# Patient Record
Sex: Male | Born: 1990 | Race: Black or African American | Hispanic: No | Marital: Single | Smoking: Current every day smoker
Health system: Southern US, Community
[De-identification: ages and names within clinical notes are randomized; demographics above are authoritative.]

---

## 2012-01-04 ENCOUNTER — Emergency Department (INDEPENDENT_AMBULATORY_CARE_PROVIDER_SITE_OTHER)
Admission: EM | Admit: 2012-01-04 | Discharge: 2012-01-04 | Disposition: A | Discharge status: Home / Self Care | Payer: Medicaid Other | Source: Home / Self Care | Attending: Family Medicine | Admitting: Family Medicine

## 2012-01-04 ENCOUNTER — Encounter (HOSPITAL_COMMUNITY): Payer: Self-pay | Admitting: *Deleted

## 2012-01-04 DIAGNOSIS — R21 Rash and other nonspecific skin eruption: Secondary | ICD-10-CM

## 2012-01-04 DIAGNOSIS — L309 Dermatitis, unspecified: Secondary | ICD-10-CM

## 2012-01-04 DIAGNOSIS — L259 Unspecified contact dermatitis, unspecified cause: Secondary | ICD-10-CM

## 2012-01-04 MED ORDER — TRIAMCINOLONE ACETONIDE 0.1 % EX CREA: TOPICAL_CREAM | Freq: Two times a day (BID) | CUTANEOUS | Status: DC

## 2012-01-04 MED ORDER — PREDNISONE 10 MG PO TABS: 10.0000 mg | ORAL_TABLET | Freq: Every day | ORAL | Status: DC

## 2012-01-04 NOTE — Discharge Instructions (Signed)

## 2012-01-04 NOTE — ED Provider Notes (Signed)
History     CSN: [id number]  Arrival date & time 01/04/12  1919   First MD Initiated Contact with Patient 01/04/12 2058      Chief Complaint  Patient presents with  . Rash    (Consider location/radiation/quality/duration/timing/severity/associated sxs/prior treatment) Patient is a 21 y.o. male presenting with rash. The history is provided by the patient. No language interpreter was used.  Rash  This is a chronic problem. Episode onset: several months. The problem is associated with an unknown factor. There has been no fever. The rash is present on the left arm, left upper leg, right upper leg and right arm. The pain is at a severity of 6/10. The pain is moderate. The pain has been worsening since onset. He has tried nothing for the symptoms. The treatment provided moderate relief.    History reviewed. No pertinent past medical history.  History reviewed. No pertinent past surgical history.  Family History  Problem Relation Age of Onset  . Diabetes Mother   . Hypertension Mother     History  Substance Use Topics  . Smoking status: Not on file  . Smokeless tobacco: Not on file  . Alcohol Use:       Review of Systems  Skin: Positive for rash.  All other systems reviewed and are negative.    Allergies  Review of patient's allergies indicates no known allergies.  Home Medications   Current Outpatient Rx  Name Route Sig Dispense Refill  . PREDNISONE 10 MG PO TABS Oral Take 1 tablet (10 mg total) by mouth daily. 21 tablet 0  . TRIAMCINOLONE ACETONIDE 0.1 % EX CREA Topical Apply topically 2 (two) times daily. 60 g 0    BP 124/70  Pulse 78  Temp(Src) 98.6 F (37 C) (Oral)  Resp 18  SpO2 100%  Physical Exam  Nursing note and vitals reviewed. Constitutional: He is oriented to person, place, and time. He appears well-developed and well-nourished.  HENT:  Head: Normocephalic and atraumatic.  Right Ear: External ear normal.  Left Ear: External ear normal.  Eyes:  Conjunctivae are normal. Pupils are equal, round, and reactive to light.  Cardiovascular: Normal rate and regular rhythm.   Pulmonary/Chest: Effort normal.  Abdominal: Soft.  Musculoskeletal: Normal range of motion.  Neurological: He is alert and oriented to person, place, and time.  Skin: Rash noted.  Psychiatric: He has a normal mood and affect.    ED Course  Procedures (including critical care time)  Labs Reviewed - No data to display No results found.   1. Eczema   2. Rash       MDM  Triamcinalone,   Cream,   Prednisone,   Referral to dermatology and [facility] to obtain a regular md        Lonia Skinner [place], [place] 01/04/12 2114

## 2012-01-04 NOTE — ED Notes (Signed)
Rash  On armpits      Both  Arms   And       Armpits  As  Well  As  Gluteal  Area          Started  Off in armpits     sev  Months  Ago       Spreading  Worse  sev  Weeks  Ago  It  Itches         No  New  Medications         No  Known  Causative      Agents     The  Rash   Itches     -  No  Angioedema      No  resp    Distress  He  Has  Tried    Neosporin  Cortisone  And  Zyrtec

## 2012-01-05 NOTE — ED Notes (Signed)
NO PAIN

## 2012-01-05 NOTE — ED Provider Notes (Signed)
Medical screening examination/treatment/procedure(s) were performed by resident physician or non-physician practitioner and as supervising physician I was immediately available for consultation/collaboration.   Roran Wegner DOUGLAS MD.    Ayansh Feutz D Jakaden Ouzts, MD 01/05/12 1219 

## 2012-12-08 ENCOUNTER — Encounter (HOSPITAL_COMMUNITY): Payer: Self-pay | Admitting: Emergency Medicine

## 2012-12-08 ENCOUNTER — Emergency Department (HOSPITAL_COMMUNITY): Payer: No Typology Code available for payment source

## 2012-12-08 ENCOUNTER — Emergency Department (HOSPITAL_COMMUNITY)
Admission: EM | Admit: 2012-12-08 | Discharge: 2012-12-09 | Disposition: A | Discharge status: Home / Self Care | Payer: No Typology Code available for payment source | Attending: Emergency Medicine | Admitting: Emergency Medicine

## 2012-12-08 DIAGNOSIS — Y9389 Activity, other specified: Secondary | ICD-10-CM | POA: Insufficient documentation

## 2012-12-08 DIAGNOSIS — Y92410 Unspecified street and highway as the place of occurrence of the external cause: Secondary | ICD-10-CM | POA: Insufficient documentation

## 2012-12-08 DIAGNOSIS — S0990XA Unspecified injury of head, initial encounter: Secondary | ICD-10-CM | POA: Insufficient documentation

## 2012-12-08 DIAGNOSIS — S0993XA Unspecified injury of face, initial encounter: Secondary | ICD-10-CM | POA: Insufficient documentation

## 2012-12-08 DIAGNOSIS — S8990XA Unspecified injury of unspecified lower leg, initial encounter: Secondary | ICD-10-CM | POA: Insufficient documentation

## 2012-12-08 DIAGNOSIS — S99919A Unspecified injury of unspecified ankle, initial encounter: Secondary | ICD-10-CM | POA: Insufficient documentation
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
## 2012-12-09 MED ORDER — IBUPROFEN 600 MG PO TABS: 600.0000 mg | ORAL_TABLET | Freq: Four times a day (QID) | ORAL | Status: DC | PRN

## 2012-12-09 MED ORDER — HYDROCODONE-ACETAMINOPHEN 5-325 MG PO TABS: 2.0000 | ORAL_TABLET | ORAL | Status: DC | PRN

## 2012-12-09 NOTE — ED Notes (Signed)
Rx x 2.  Pt voiced understanding to f/u with PCP and return for worsening condition.  

## 2012-12-10 NOTE — ED Provider Notes (Signed)
Medical screening examination/treatment/procedure(s) were performed by non-physician practitioner and as supervising physician I was immediately available for consultation/collaboration.    Shelda Jakes, MD 12/10/12 [phone]

## 2013-07-17 ENCOUNTER — Emergency Department (HOSPITAL_COMMUNITY)
Admission: EM | Admit: 2013-07-17 | Discharge: 2013-07-17 | Disposition: A | Discharge status: Home / Self Care | Payer: No Typology Code available for payment source | Attending: Emergency Medicine | Admitting: Emergency Medicine

## 2013-07-17 ENCOUNTER — Encounter (HOSPITAL_COMMUNITY): Payer: Self-pay | Admitting: Emergency Medicine

## 2013-07-17 DIAGNOSIS — Y9389 Activity, other specified: Secondary | ICD-10-CM | POA: Insufficient documentation

## 2013-07-17 DIAGNOSIS — S298XXA Other specified injuries of thorax, initial encounter: Secondary | ICD-10-CM | POA: Insufficient documentation

## 2013-07-17 DIAGNOSIS — Y92410 Unspecified street and highway as the place of occurrence of the external cause: Secondary | ICD-10-CM | POA: Insufficient documentation

## 2013-07-17 DIAGNOSIS — IMO0002 Reserved for concepts with insufficient information to code with codable children: Secondary | ICD-10-CM | POA: Insufficient documentation

## 2013-07-17 DIAGNOSIS — M549 Dorsalgia, unspecified: Secondary | ICD-10-CM

## 2013-07-17 MED ORDER — METHOCARBAMOL 500 MG PO TABS: 1000.0000 mg | ORAL_TABLET | Freq: Four times a day (QID) | ORAL | Status: AC

## 2013-07-17 MED ORDER — NAPROXEN 500 MG PO TABS: 500.0000 mg | ORAL_TABLET | Freq: Two times a day (BID) | ORAL | Status: AC
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
## 2013-07-17 NOTE — ED Provider Notes (Signed)
Medical screening examination/treatment/procedure(s) were performed by non-physician practitioner and as supervising physician I was immediately available for consultation/collaboration.  EKG Interpretation   None         Celene Kras, MD 07/17/13 [phone]

## 2018-08-18 ENCOUNTER — Encounter (HOSPITAL_COMMUNITY): Payer: Self-pay | Admitting: *Deleted

## 2018-08-18 ENCOUNTER — Emergency Department (HOSPITAL_COMMUNITY): Payer: Self-pay

## 2018-08-18 ENCOUNTER — Emergency Department (HOSPITAL_COMMUNITY)
Admission: EM | Admit: 2018-08-18 | Discharge: 2018-08-18 | Disposition: A | Discharge status: Home / Self Care | Payer: Self-pay | Attending: Emergency Medicine | Admitting: Emergency Medicine

## 2018-08-18 ENCOUNTER — Other Ambulatory Visit: Payer: Self-pay

## 2018-08-18 DIAGNOSIS — R0789 Other chest pain: Secondary | ICD-10-CM | POA: Insufficient documentation

## 2018-08-18 DIAGNOSIS — F17210 Nicotine dependence, cigarettes, uncomplicated: Secondary | ICD-10-CM | POA: Insufficient documentation

## 2018-08-18 LAB — BASIC METABOLIC PANEL
Anion gap: 8 (ref 5–15)
BUN: 11 mg/dL (ref 6–20)
CHLORIDE: 105 mmol/L (ref 98–111)
CO2: 25 mmol/L (ref 22–32)
Calcium: 9 mg/dL (ref 8.9–10.3)
Creatinine, Ser: 0.92 mg/dL (ref 0.61–1.24)
GFR calc Af Amer: >60 mL/min (ref >60)
GFR calc non Af Amer: >60 mL/min (ref >60)
GLUCOSE: 89 mg/dL (ref 70–99)
POTASSIUM: 3.8 mmol/L (ref 3.5–5.1)
SODIUM: 138 mmol/L (ref 135–145)

## 2018-08-18 LAB — CBC
HEMATOCRIT: 43.7 % (ref 39.0–52.0)
HEMOGLOBIN: 14.5 g/dL (ref 13.0–17.0)
MCH: 31.9 pg (ref 26.0–34.0)
MCHC: 33.2 g/dL (ref 30.0–36.0)
MCV: 96.3 fL (ref 80.0–100.0)
Platelets: 251 10*3/uL (ref 150–400)
RBC: 4.54 MIL/uL (ref 4.22–5.81)
RDW: 13 % (ref 11.5–15.5)
WBC: 7.1 10*3/uL (ref 4.0–10.5)
nRBC: 0 % (ref 0.0–0.2)

## 2018-08-18 LAB — I-STAT TROPONIN, ED: Troponin i, poc: 0 ng/mL (ref 0.00–0.08)

## 2018-08-18 NOTE — ED Provider Notes (Signed)
[facility] Note   CSN: [id number] Arrival date & time: 08/18/18  1600     History   Chief Complaint Chief Complaint  Patient presents with  . Chest Pain    HPI Jacob Vance is a 27 y.o. male.  27 year old male presents with complaint of left-sided chest pain onset yesterday morning upon waking.  Patient states pain is sharp in nature, constant, did improve at some point yesterday for about 1 hour and then resolved and has been constant since that time.  Nothing makes pain better or worse, does not radiate, not associated with nausea or vomiting, diaphoresis, shortness of breath.  Patient states that he has also had altered sensation of his left arm and the leg since yesterday morning as well however this has resolved as of this time.  Patient denies any past medical history, no family cardiac history, patient is a daily smoker.  No other complaints or concerns.     History reviewed. No pertinent past medical history.  There are no active problems to display for this patient.   History reviewed. No pertinent surgical history.      Home Medications    Prior to Admission medications   Medication Sig Start Date End Date Taking? Authorizing Provider  methocarbamol (ROBAXIN) 500 MG tablet Take 2 tablets (1,000 mg total) by mouth 4 (four) times daily. 07/17/13   Renne CriglerGeiple, Joshua, PA-C  naproxen (NAPROSYN) 500 MG tablet Take 1 tablet (500 mg total) by mouth 2 (two) times daily. 07/17/13   Renne CriglerGeiple, Joshua, PA-C  Naproxen Sodium (ALEVE) 220 MG CAPS Take 220 mg by mouth every 8 (eight) hours as needed (pain).    [provider]    Family History Family History  Problem Relation Age of Onset  . Diabetes Mother   . Hypertension Mother     Social History Social History   Tobacco Use  . Smoking status: Current Every Day Smoker  . Smokeless tobacco: Never Used  Substance Use Topics  . Alcohol use: No  . Drug  use: Not on file     Allergies   Patient has no known allergies.   Review of Systems Review of Systems  Constitutional: Negative for chills, diaphoresis and fever.  Eyes: Negative for visual disturbance.  Respiratory: Negative for cough, chest tightness and shortness of breath.   Cardiovascular: Positive for chest pain. Negative for palpitations and leg swelling.  Gastrointestinal: Negative for abdominal pain, nausea and vomiting.  Musculoskeletal: Negative for back pain, gait problem and neck pain.  Skin: Negative for rash and wound.  Allergic/Immunologic: Negative for immunocompromised state.  Neurological: Negative for speech difficulty, weakness and numbness.  Hematological: Does not bruise/bleed easily.  Psychiatric/Behavioral: Negative for confusion.  All other systems reviewed and are negative.    Physical Exam Updated Vital Signs BP 120/69 (BP Location: Right Arm)   Pulse 76   Temp 98.7 F (37.1 C) (Oral)   Resp 17   Ht 5\' 6"  (1.676 m)   Wt 108.9 kg   SpO2 99%   BMI 38.74 kg/m   Physical Exam Vitals signs and nursing note reviewed.  Constitutional:      General: He is not in acute distress.    Appearance: He is well-developed. He is obese. He is not diaphoretic.  HENT:     Head: Normocephalic and atraumatic.  Cardiovascular:     Rate and Rhythm: Normal rate and regular rhythm.     Heart sounds: Normal heart sounds.  No murmur.  Pulmonary:     Effort: Pulmonary effort is normal.     Breath sounds: Normal breath sounds.  Chest:     Chest wall: No tenderness or crepitus.  Abdominal:     Palpations: Abdomen is soft.     Tenderness: There is no abdominal tenderness.  Musculoskeletal:     Right lower leg: He exhibits no tenderness. No edema.     Left lower leg: He exhibits no tenderness. No edema.  Skin:    General: Skin is warm and dry.     Findings: No erythema or rash.  Neurological:     Mental Status: He is alert. He is disoriented.     GCS: GCS  eye subscore is 4. GCS verbal subscore is 5. GCS motor subscore is 6.     Cranial Nerves: Cranial nerves are intact. No facial asymmetry.     Sensory: Sensation is intact.     Motor: Motor function is intact. No weakness or pronator drift.  Psychiatric:        Mood and Affect: Mood normal.        Behavior: Behavior normal.      ED Treatments / Results  Labs (all labs ordered are listed, but only abnormal results are displayed) Labs Reviewed  BASIC METABOLIC PANEL  CBC  I-STAT TROPONIN, ED    EKG EKG Interpretation  Date/Time:  Sunday August 18 2018 16:11:32 EST Ventricular Rate:  72 PR Interval:    QRS Duration: 88 QT Interval:  364 QTC Calculation: 399 R Axis:   71 Text Interpretation:  Sinus rhythm Borderline T wave abnormalities No significant change was found Confirmed by Mancel BaleWentz, Elliott [phone]([id number]) on 08/18/2018 6:49:48 PM   Radiology Dg Chest 2 View  Result Date: 08/18/2018 CLINICAL DATA:  Left upper chest pain beginning today. Left arm and leg numbness. EXAM: CHEST - 2 VIEW COMPARISON:  12/08/2012 FINDINGS: Heart size is normal. Mediastinal shadows are normal. The lungs are clear. No bronchial thickening. No infiltrate, mass, effusion or collapse. Pulmonary vascularity is normal. No bony abnormality. IMPRESSION: Normal chest. Electronically Signed   By: Paulina FusiMark  Shogry M.D.   On: 08/18/2018 17:26    Procedures Procedures (including critical care time)  Medications Ordered in ED Medications - No data to display   Initial Impression / Assessment and Plan / ED Course  I have reviewed the triage vital signs and the nursing notes.  Pertinent labs & imaging results that were available during my care of the patient were reviewed by me and considered in my medical decision making (see chart for details).  Clinical Course as of Aug 18 1940  Sun Aug 18, 2018  [id number] 27 year old male presents with complaint of left-sided chest pain.  Exam is unremarkable, lab work including  troponin, CBC, BMP are within normal limits, chest x-ray unremarkable, EKG without acute ischemic changes.  Discharged to follow-up with PCP, [facility] if [facility], return to ER for new or worsening symptoms.   [LM]    Clinical Course User Index [LM] Jeannie FendMurphy, Dvora Buitron A, PA-C   Final Clinical Impressions(s) / ED Diagnoses   Final diagnoses:  Atypical chest pain    ED Discharge Orders    None       Alden HippMurphy, Zylen Wenig A, PA-C 08/18/18 1941    Mancel BaleWentz, Elliott, MD 08/18/18 2252

## 2018-08-18 NOTE — ED Triage Notes (Signed)
Pt c/o lt upper chest pain all day no son nuasea or dizziness  No recent cold or cough  No previous history  Alert no distress

## 2018-08-18 NOTE — ED Notes (Signed)
Patient not in room to draw blood.

## 2018-08-18 NOTE — ED Notes (Signed)
Pt c/o al\so of lt arm and leg numbness for the same aount of time  Better but different

## 2018-08-18 NOTE — Discharge Instructions (Addendum)
Follow up with your doctor, referral to [facility] if you do not have a primary care provider. Return to the ER for new or worsening symptoms.

## 2018-08-18 NOTE — ED Notes (Signed)
To x-ray

## 2020-03-27 IMAGING — CR DG CHEST 2V
2 series · 2 of 2 positions shown · non-contrast
Comparison: 12/08/2012

CLINICAL DATA: Left upper chest pain beginning today. Left arm and
leg numbness.

EXAM:
CHEST - 2 VIEW

[chest pa]
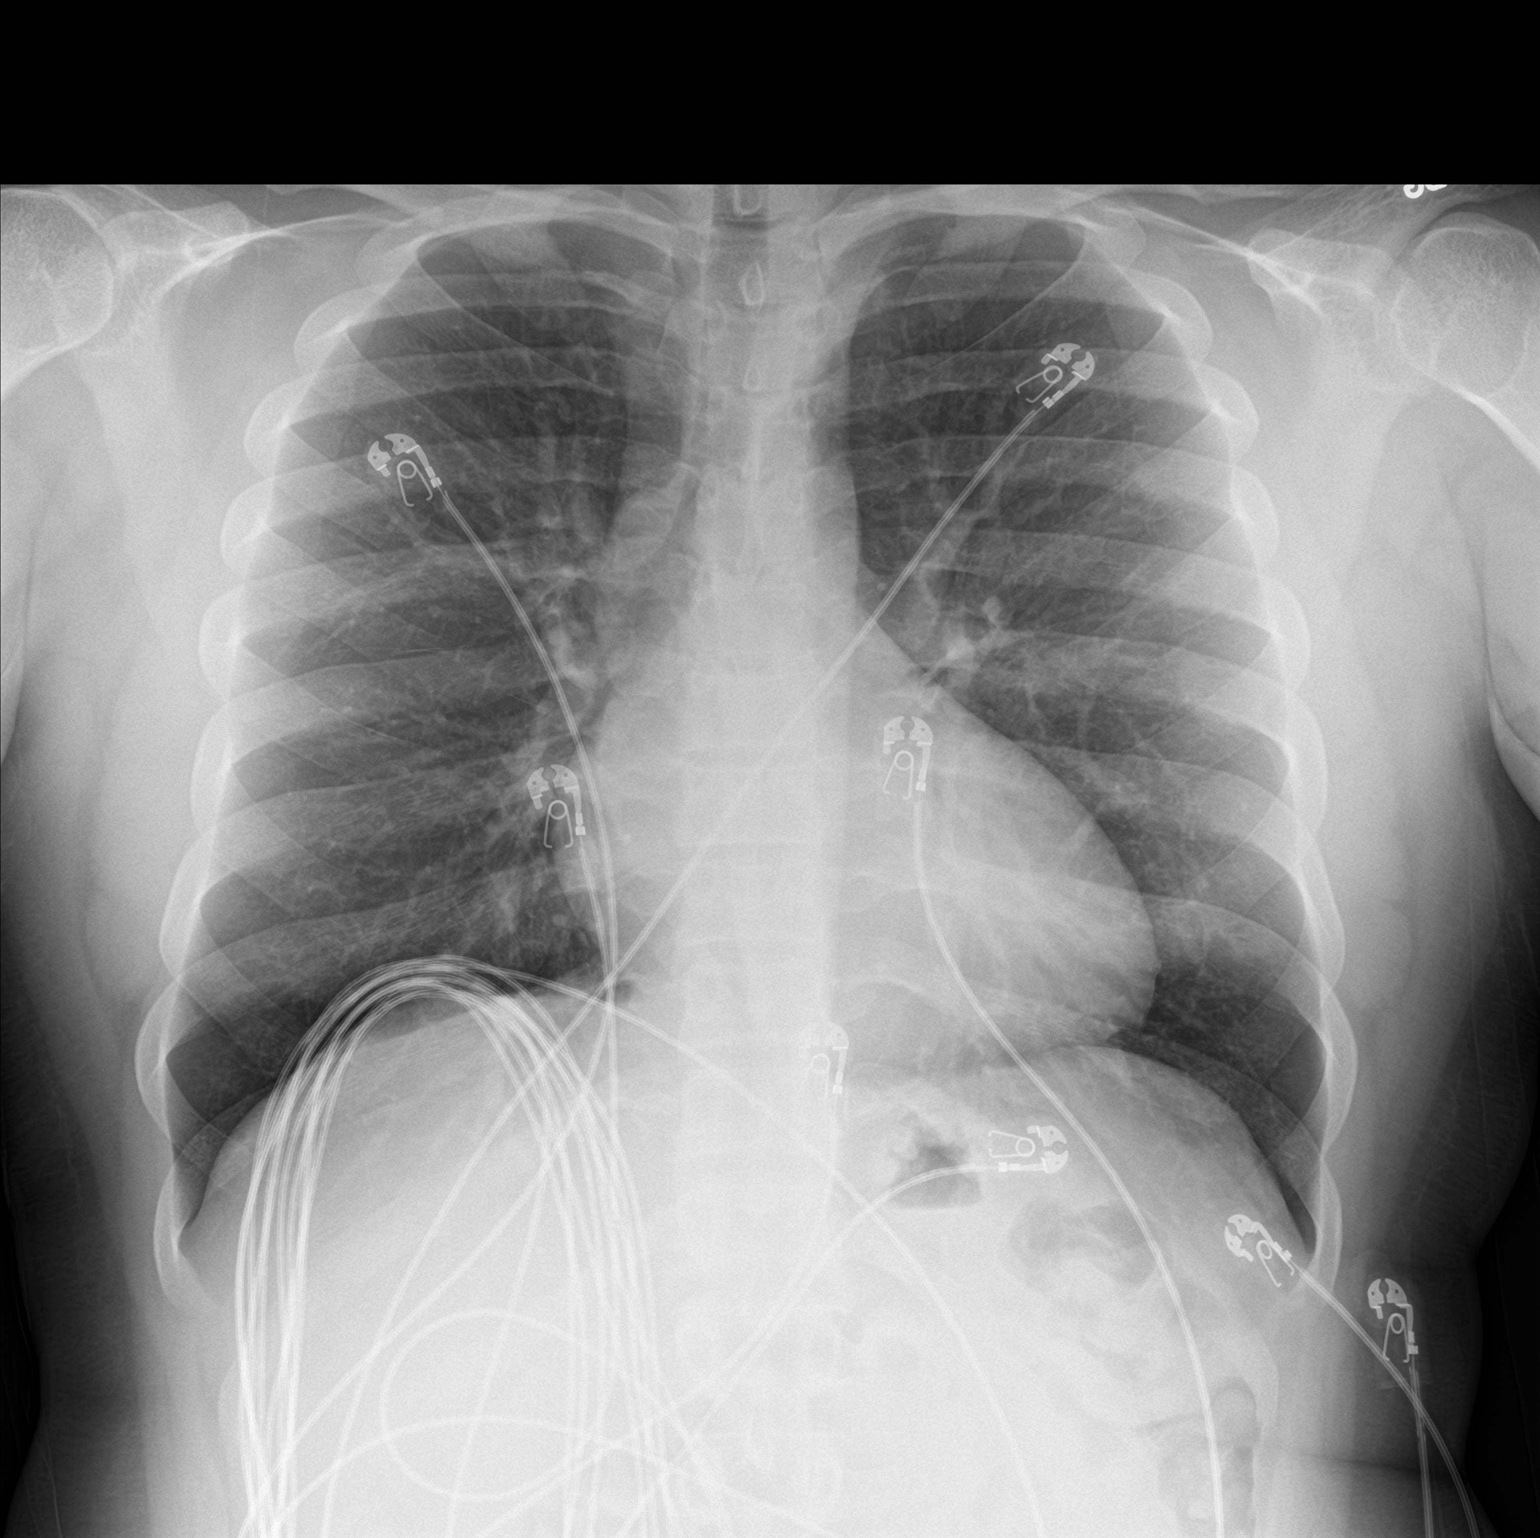

[chest lat]
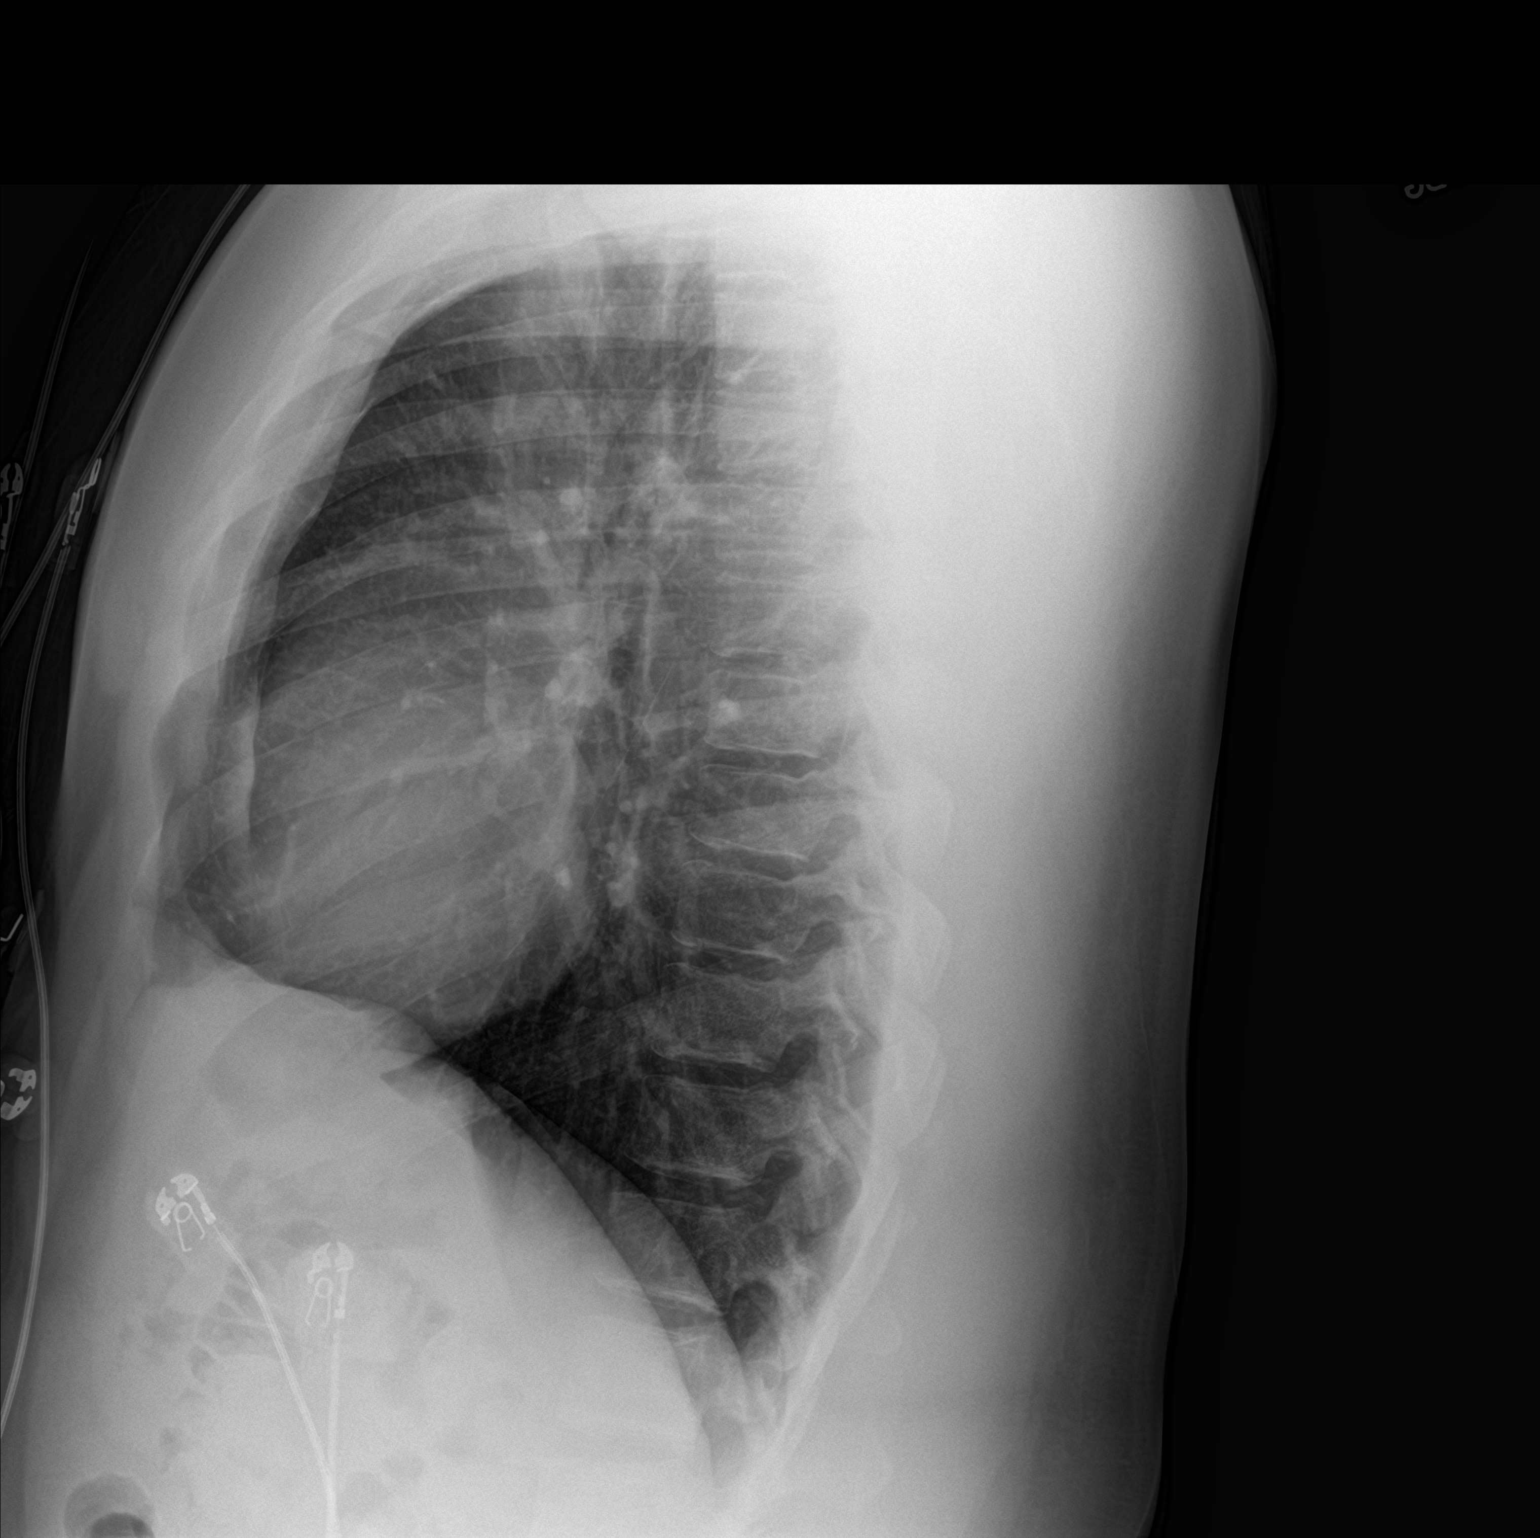

[2 of 2 positions shown; findings below may reference images not displayed]

FINDINGS: Heart size is normal. Mediastinal shadows are normal. The lungs are
clear. No bronchial thickening. No infiltrate, mass, effusion or
collapse. Pulmonary vascularity is normal. No bony abnormality.
IMPRESSION: Normal chest.

## 2021-06-20 ENCOUNTER — Ambulatory Visit (HOSPITAL_COMMUNITY)
Admission: RE | Admit: 2021-06-20 | Discharge: 2021-06-20 | Disposition: A | Discharge status: Home / Self Care | Payer: 59 | Source: Ambulatory Visit | Attending: Family Medicine | Admitting: Family Medicine

## 2021-06-20 ENCOUNTER — Other Ambulatory Visit: Payer: Self-pay

## 2021-06-20 ENCOUNTER — Encounter (HOSPITAL_COMMUNITY): Payer: Self-pay

## 2021-06-20 VITALS — BP 124/81 | HR 78 | Temp 98.4°F | Resp 16

## 2021-06-20 DIAGNOSIS — Z23 Encounter for immunization: Secondary | ICD-10-CM

## 2021-06-20 DIAGNOSIS — S51831A Puncture wound without foreign body of right forearm, initial encounter: Secondary | ICD-10-CM

## 2021-06-20 DIAGNOSIS — W540XXA Bitten by dog, initial encounter: Secondary | ICD-10-CM

## 2021-06-20 MED ORDER — TETANUS-DIPHTH-ACELL PERTUSSIS 5-2.5-18.5 LF-MCG/0.5 IM SUSY
0.5000 mL | PREFILLED_SYRINGE | Freq: Once | INTRAMUSCULAR | Status: AC
  Administered 2021-06-20: 0.5 mL via INTRAMUSCULAR

## 2021-06-20 MED ORDER — TETANUS-DIPHTH-ACELL PERTUSSIS 5-2.5-18.5 LF-MCG/0.5 IM SUSY
PREFILLED_SYRINGE | INTRAMUSCULAR | Status: AC
  Filled 2021-06-20: qty 0.5

## 2021-06-20 MED ORDER — AMOXICILLIN-POT CLAVULANATE 875-125 MG PO TABS: 1.0000 | ORAL_TABLET | Freq: Two times a day (BID) | ORAL | 0 refills | Status: AC

## 2021-06-20 NOTE — Discharge Instructions (Signed)
Meds ordered this encounter  Medications   amoxicillin-clavulanate (AUGMENTIN) 875-125 MG tablet    Sig: Take 1 tablet by mouth every 12 (twelve) hours.    Dispense:  14 tablet    Refill:  0   Tdap (BOOSTRIX) injection 0.5 mL    

## 2021-06-20 NOTE — ED Triage Notes (Signed)
Pt got bit by his dog yesterday on right wrist. Reports dog shots UTD. Unsure when last tetanus shot was

## 2021-06-20 NOTE — ED Provider Notes (Signed)
  [facility]   [id number] 06/20/21 Arrival Time: 1002  ASSESSMENT & PLAN:  1. Puncture wound of right forearm, initial encounter   2. Dog bite, initial encounter    Simple wound care. Begin: Meds ordered this encounter  Medications   amoxicillin-clavulanate (AUGMENTIN) 875-125 MG tablet    Sig: Take 1 tablet by mouth every 12 (twelve) hours.    Dispense:  14 tablet    Refill:  0   Tdap (BOOSTRIX) injection 0.5 mL   OTC analgesics if needed. Activities as tolerated.  Recommend:  Follow-up Information     [facility]   Specialty: Urgent Care Why: If worsening or failing to improve as anticipated. Contact information: [address] [place] [address] [phone]                 Reviewed expectations re: course of current medical issues. Questions answered. Outlined signs and symptoms indicating need for more acute intervention. Patient verbalized understanding. After Visit Summary given.  SUBJECTIVE: History from: patient. Jacob Vance is a 30 y.o. male who reports dog bite to R forearm; yesterday; his dog; UTD on shots. Unknown last Td. Wound is somewhat painful. No bleeding or drainage. Mild swelling. No extremity sensation changes or weakness. Washed at home.  History reviewed. No pertinent surgical history.    OBJECTIVE:  Vitals:   06/20/21 1021  BP: 124/81  Pulse: 78  Resp: 16  Temp: 98.4 F (36.9 C)  TempSrc: Oral  SpO2: 98%    General appearance: alert; no distress HEENT: [place]; AT Neck: supple with FROM Resp: unlabored respirations Extremities: RUE: warm with well perfused appearance; 1-41mm puncture wound of R distal forearm just prox to wrist; TTP; wrist with FROM CV: brisk extremity capillary refill of RUE; 2+ radial pulse of RUE. Skin: warm and dry; no visible rashes Neurologic: gait normal; normal sensation and strength of RUE Psychological: alert and cooperative; normal  mood and affect    No Known Allergies  History reviewed. No pertinent past medical history. Social History   Socioeconomic History   Marital status: Single    Spouse name: Not on file   Number of children: Not on file   Years of education: Not on file   Highest education level: Not on file  Occupational History   Not on file  Tobacco Use   Smoking status: Every Day   Smokeless tobacco: Never  Substance and Sexual Activity   Alcohol use: No   Drug use: Not on file   Sexual activity: Not on file  Other Topics Concern   Not on file  Social History Narrative   Not on file   Social Determinants of Health   Financial Resource Strain: Not on file  Food Insecurity: Not on file  Transportation Needs: Not on file  Physical Activity: Not on file  Stress: Not on file  Social Connections: Not on file   Family History  Problem Relation Age of Onset   Diabetes Mother    Hypertension Mother    History reviewed. No pertinent surgical history.     Mardella Layman, MD 06/20/21 1039
